# Patient Record
Sex: Male | Born: 1995 | Race: White | Hispanic: No | Marital: Single | Smoking: Never smoker
Health system: Southern US, Community
[De-identification: ages and names within clinical notes are randomized; demographics above are authoritative.]

## PROBLEM LIST (undated history)

## (undated) DIAGNOSIS — J45909 Unspecified asthma, uncomplicated: Secondary | ICD-10-CM

## (undated) DIAGNOSIS — F419 Anxiety disorder, unspecified: Secondary | ICD-10-CM

## (undated) HISTORY — DX: Unspecified asthma, uncomplicated: J45.909

## (undated) HISTORY — DX: Anxiety disorder, unspecified: F41.9

---
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
## 2015-01-04 ENCOUNTER — Ambulatory Visit (INDEPENDENT_AMBULATORY_CARE_PROVIDER_SITE_OTHER): Payer: PRIVATE HEALTH INSURANCE | Admitting: Podiatry

## 2015-01-04 ENCOUNTER — Ambulatory Visit (INDEPENDENT_AMBULATORY_CARE_PROVIDER_SITE_OTHER): Payer: PRIVATE HEALTH INSURANCE

## 2015-01-04 ENCOUNTER — Encounter: Payer: Self-pay | Admitting: Podiatry

## 2015-01-04 VITALS — Ht 69.0 in | Wt 180.0 lb

## 2015-01-04 DIAGNOSIS — M799 Soft tissue disorder, unspecified: Secondary | ICD-10-CM

## 2015-01-04 DIAGNOSIS — M7989 Other specified soft tissue disorders: Secondary | ICD-10-CM

## 2015-01-04 DIAGNOSIS — M76822 Posterior tibial tendinitis, left leg: Secondary | ICD-10-CM

## 2015-01-04 NOTE — Progress Notes (Signed)
   Subjective:    Patient ID: Hayden Willis, male    DOB: Mar 07, 1996, 19 y.o.   MRN: [id number]  HPI knot on both feet , i guess im here for a surgery consult , was here years ago for the left foot. Knot on the medial side of navicular bone bilateral     Review of Systems     Objective:   Physical Exam: I have reviewed his past medical history medications allergies surgery social history and review of systems. Pulses are strongly palpable bilateral. Neurologic sensorium is intact per Semmes-Weinstein monofilament. Deep tendon reflexes are intact. Muscle strength +5 over 5 was afflicted and flexes inverters and everters all intrinsic musculature is intact. Orthopedic evaluation of the straight all joints distal to the ankle for range of motion without crepitation. He has large palpable mass to the medial aspect of the navicular tuberosity. Radiographs do demonstrate a hypertrophic tubercle of the navicular. This very well could be a fused accessory ossicle. His left foot is worse than his right.         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 Left greater than right.  Plan: Discussed etiology pathology conservative versus surgical therapies. We discussed the need for surgical excision of a portion of this navicular bone. This is called a Radio broadcast assistantKidner procedure. We discussed this in great detail today went over a consent form line by line number by number giving him ample time to ask questions he saw fit regarding a Kidner posterior tibial tendon advancement with resection of the hypertrophic navicular bone. I answered all of the questions regarding this procedure is the best of my ability in layman's terms. We discussed the possible postop complications which may include but are not limited to postop pain bleeding swelling infection need for further surgery muscle limb loss of life. He signed out through patient the  consent form and I will follow-up with him in the near future. He notes that he will be nonweightbearing utilizing crutches and a cast.

## 2015-01-22 ENCOUNTER — Telehealth: Payer: Self-pay | Admitting: *Deleted

## 2015-01-22 NOTE — Telephone Encounter (Signed)
PATIENT CALLED AND STATED HE NEEDS TO CANCEL HIS SURGERY FOR THE 15TH OF THIS MONTH, HE HAS [facility]YOUTS ON THE 11TH WILL CALL US BACK TO RESCHEDULE

## 2015-02-03 ENCOUNTER — Encounter: Payer: PRIVATE HEALTH INSURANCE | Admitting: Podiatry

## 2015-02-18 ENCOUNTER — Emergency Department (INDEPENDENT_AMBULATORY_CARE_PROVIDER_SITE_OTHER)
Admission: EM | Admit: 2015-02-18 | Discharge: 2015-02-18 | Disposition: A | Discharge status: Home / Self Care | Payer: PRIVATE HEALTH INSURANCE | Source: Home / Self Care | Attending: Family Medicine | Admitting: Family Medicine

## 2015-02-18 ENCOUNTER — Encounter (HOSPITAL_COMMUNITY): Payer: Self-pay | Admitting: Emergency Medicine

## 2015-02-18 ENCOUNTER — Other Ambulatory Visit (HOSPITAL_COMMUNITY)
Admission: RE | Admit: 2015-02-18 | Discharge: 2015-02-18 | Disposition: A | Discharge status: Home / Self Care | Payer: PRIVATE HEALTH INSURANCE | Source: Ambulatory Visit | Attending: Family Medicine | Admitting: Family Medicine

## 2015-02-18 DIAGNOSIS — Z202 Contact with and (suspected) exposure to infections with a predominantly sexual mode of transmission: Secondary | ICD-10-CM

## 2015-02-18 DIAGNOSIS — R21 Rash and other nonspecific skin eruption: Secondary | ICD-10-CM

## 2015-02-18 DIAGNOSIS — Z113 Encounter for screening for infections with a predominantly sexual mode of transmission: Secondary | ICD-10-CM | POA: Diagnosis not present

## 2015-02-18 LAB — POCT URINALYSIS DIP (DEVICE)
Bilirubin Urine: NEGATIVE
Glucose, UA: NEGATIVE mg/dL
HGB URINE DIPSTICK: NEGATIVE
Ketones, ur: NEGATIVE mg/dL
Leukocytes, UA: NEGATIVE
NITRITE: NEGATIVE
Protein, ur: NEGATIVE mg/dL
Specific Gravity, Urine: 1.025 (ref 1.005–1.030)
UROBILINOGEN UA: 0.2 mg/dL (ref 0.0–1.0)
pH: 7 (ref 5.0–8.0)

## 2015-02-18 MED ORDER — PREDNISONE 10 MG (48) PO TBPK: ORAL_TABLET | Freq: Every day | ORAL | Status: DC

## 2015-02-18 NOTE — Discharge Instructions (Signed)
Your rash is likely from an allergic reaction to something you're coming into contact with the environment or a new soap or detergent. Please start the steroids and taken to completion. Please also start a daily allergy pill such as Zyrtec or Allegra.We will call you if any of your lab results are positive for infection.

## 2015-02-18 NOTE — ED Provider Notes (Signed)
CSN: [id number]     Arrival date & time 02/18/15  0955 History   First MD Initiated Contact with Patient 02/18/15 1121     Chief Complaint  Patient presents with  . Rash  . Exposure to STD    possible exposure   (Consider location/radiation/quality/duration/timing/severity/associated sxs/prior Treatment) HPI  L handand bilateral ankle rash. Intermittently mildly itchy. Started 3 days ago. Getting worse. Putting poison ivy spray w/ improvement. Rash worsens every time he works outside. 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  Sexual intercourse w/o condoms. Patient's x-rays with multiple partners. Not aware of any STDs at his partners have. Patient denies penile discharge, pain, groin adenopathy, fevers, nausea, vomiting, abdominal pain  History reviewed. No pertinent past medical history. History reviewed. No pertinent past surgical history. Family History  Problem Relation Age of Onset  . Diabetes Mother    - Father w/ Diabetes History  Substance Use Topics  . Smoking status: Never Smoker   . Smokeless tobacco: Not on file  . Alcohol Use: No    Review of Systems Per HPI with all other pertinent systems negative.   Allergies  Review of patient's allergies indicates no known allergies.  Home Medications   Prior to Admission medications   Medication Sig Start Date End Date Taking? Authorizing Provider  predniSONE (STERAPRED UNI-PAK 48 TAB) 10 MG (48) TBPK tablet Take by mouth daily. Take as instructed 02/18/15   Ozella Rocksavid J Gitty Osterlund, MD   BP 140/93 mmHg  Pulse 70  Temp(Src) 97.8 F (36.6 C) (Oral)  Resp 12  SpO2 97% Physical Exam Physical Exam  Constitutional: oriented to person, place, and time. appears well-developed and well-nourished. No distress.  HENT:  Head: Normocephalic and atraumatic.  Eyes: EOMI. PERRL.  Neck: Normal range of motion.  Cardiovascular: RRR, no m/r/g, 2+ distal pulses,  Pulmonary/Chest: Effort normal and breath sounds normal. No respiratory  distress.  Abdominal: Soft. Bowel sounds are normal. NonTTP, no distension.  Musculoskeletal: Normal range of motion. Non ttp, no effusion.  Neurological: alert and oriented to person, place, and time.  Skin: mild diffuse erythematous, papular rash on left dorsum of hand and bilateral ankles.Marland Kitchen.  Psychiatric: normal mood and affect. behavior is normal. Judgment and thought content normal.   ED Course  Procedures (including critical care time) Labs Review Labs Reviewed - No data to display  Imaging Review No results found.   MDM   1. Possible exposure to STD   2. Rash    Prednisone Dosepak, start day allergy pill.etiology not immediately clear but likely allergic reaction as opposed to infectious. STD panel sent    Ozella Rocksavid J Neal Trulson, MD 02/18/15 [phone]

## 2015-02-18 NOTE — ED Notes (Signed)
Pt c/o rash on hands and around ankles.  Using otc rash spray with mild relief.   Pt is also requesting std testing.  Pt is asymptomatic at this time.

## 2015-02-19 LAB — HIV ANTIBODY (ROUTINE TESTING W REFLEX): HIV Screen 4th Generation wRfx: NONREACTIVE

## 2015-02-19 LAB — RPR: RPR: NONREACTIVE

## 2015-02-19 LAB — URINE CYTOLOGY ANCILLARY ONLY
Chlamydia: NEGATIVE
NEISSERIA GONORRHEA: NEGATIVE
TRICH (WINDOWPATH): NEGATIVE

## 2015-02-21 NOTE — ED Notes (Signed)
Final reports of STD screening negative for GC, chlamydia, HIV, RPR

## 2015-03-04 ENCOUNTER — Encounter: Payer: Self-pay | Admitting: Podiatry

## 2015-03-04 ENCOUNTER — Telehealth (HOSPITAL_COMMUNITY): Payer: Self-pay | Admitting: *Deleted

## 2015-03-04 NOTE — ED Notes (Signed)
[facility]. States he called and left messages but no one called him back. Pt. told we would call when nurse was available.  I called pt. Pt. verified x 2 and given neg. results.  Pt. sounds anxious and was reassured. Vassie MoselleYork, Shonna Deiter M 03/04/2015

## 2015-09-03 ENCOUNTER — Encounter: Payer: Self-pay | Admitting: Family Medicine

## 2015-09-03 ENCOUNTER — Ambulatory Visit (INDEPENDENT_AMBULATORY_CARE_PROVIDER_SITE_OTHER): Payer: PRIVATE HEALTH INSURANCE | Admitting: Family Medicine

## 2015-09-03 VITALS — BP 118/76 | HR 98 | Temp 97.8°F | Resp 16 | Ht 69.0 in | Wt 214.6 lb

## 2015-09-03 DIAGNOSIS — A059 Bacterial foodborne intoxication, unspecified: Secondary | ICD-10-CM

## 2015-09-03 DIAGNOSIS — R51 Headache: Secondary | ICD-10-CM | POA: Diagnosis not present

## 2015-09-03 DIAGNOSIS — R519 Headache, unspecified: Secondary | ICD-10-CM

## 2015-09-03 DIAGNOSIS — R42 Dizziness and giddiness: Secondary | ICD-10-CM

## 2015-09-03 DIAGNOSIS — R509 Fever, unspecified: Secondary | ICD-10-CM | POA: Diagnosis not present

## 2015-09-03 MED ORDER — OMEPRAZOLE 20 MG PO CPDR: 20.0000 mg | DELAYED_RELEASE_CAPSULE | Freq: Every day | ORAL | Status: DC

## 2015-09-03 MED ORDER — ONDANSETRON HCL 4 MG PO TABS: 4.0000 mg | ORAL_TABLET | Freq: Three times a day (TID) | ORAL | Status: DC | PRN

## 2015-09-03 NOTE — Patient Instructions (Signed)
Food Poisoning °Food poisoning is an illness caused by something you ate or drank. There are over 250 known causes of food poisoning. However, many other causes are unknown. You can be treated even if the exact cause of your food poisoning is not known. In most cases, food poisoning is mild and lasts 1 to 2 days. However, some cases can be serious, especially for people with low immune systems, the elderly, children and infants, and pregnant women. °CAUSES  °Poor personal hygiene, improper cleaning of storage and preparation areas, and unclean utensils can cause infection or tainting (contamination) of foods. The causes of food poisoning are numerous. Infectious agents, such as viruses, bacteria, or parasites, can cause harm by infecting the intestine and disrupting the absorption of nutrients and water. This can cause diarrhea and lead to dehydration. Viruses are responsible for most of the food poisonings in which an agent is found. Parasites are less likely to cause food poisoning. Toxic agents, such as poisonous mushrooms, marine algae, and pesticides can also cause food poisoning. °· Viral causes of food poisoning include: °¨ Norovirus. °¨ Rotavirus. °¨ Hepatitis A. °· Bacterial causes of food poisoning include: °¨ Salmonellae. °¨ Campylobacter. °¨ Bacillus cereus. °¨ Escherichia coli (E. coli). °¨ Shigella. °¨ Listeria monocytogenes. °¨ Clostridium botulinum (botulism). °¨ Vibrio cholerae. °· Parasites that can cause food poisoning include: °¨ Giardia. °¨ Cryptosporidium. °¨ Toxoplasma. °SYMPTOMS °Symptoms may appear several hours or longer after consuming the contaminated food or drink. Symptoms may include: °· Nausea. °· Vomiting. °· Cramping. °· Diarrhea. °· Fever and chills. °· Muscle aches. °DIAGNOSIS °Your health care provider may be able to diagnose food poisoning from a list of what you have recently eaten and results from lab tests. Diagnostic tests may include an exam of the feces. °TREATMENT °In  most cases, treatment focuses on helping to relieve your symptoms and staying well hydrated. Antibiotic medicines are rarely needed. In severe cases, hospitalization may be required. °HOME CARE INSTRUCTIONS  °· Drink enough water and fluids to keep your urine clear or pale yellow. Drink small amounts of fluids frequently and increase as tolerated. °· Ask your health care provider for specific rehydration instructions. °· Avoid: °¨ Foods high in sugar. °¨ Alcohol. °¨ Carbonated drinks. °¨ Tobacco. °¨ Juice. °¨ Caffeine drinks. °¨ Extremely hot or cold fluids. °¨ Fatty, greasy foods. °¨ Too much intake of anything at one time. °¨ Dairy products until 24 to 48 hours after diarrhea stops. °· You may consume probiotics. Probiotics are active cultures of beneficial bacteria. They may lessen the amount and number of diarrheal stools in adults. Probiotics can be found in yogurt with active cultures and in supplements. °· Wash your hands well to avoid spreading the bacteria. °· Take medicines only as directed by your health care provider. Do not give your child aspirin because of the association with Reye's syndrome. °· Ask your health care provider if you should continue to take your regular prescribed and over-the-counter medicines. °PREVENTION  °· Wash your hands, food preparation surfaces, and utensils thoroughly before and after handling raw foods. °· Keep refrigerated foods below 40°F (5°C). °· Serve hot foods immediately or keep them heated above 140°F (60°C). °· Divide large volumes of food into small portions for rapid cooling in the refrigerator. Hot, bulky foods in the refrigerator can raise the temperature of other foods that have already cooled. °· Follow approved canning procedures. °· Heat canned foods thoroughly before tasting. °· When in doubt, throw it out. °· Infants, the elderly, women   who are pregnant, and people with compromised immune systems are especially susceptible to food poisoning. These people  should never consume unpasteurized cheese, unpasteurized cider, raw fish, raw seafood, or raw meat-type products. °SEEK IMMEDIATE MEDICAL CARE IF:  °· You have difficulty breathing, swallowing, talking, or moving. °· You develop blurred vision. °· You are unable to keep fluids down. °· You faint or nearly faint. °· Your eyes turn yellow. °· Vomiting or diarrhea develops or becomes persistent. °· Abdominal pain develops, increases, or localizes in one small area. °· You have a fever. °· The diarrhea becomes excessive or contains blood or mucus. °· You develop excessive weakness, dizziness, or extreme thirst. °· You have no urine for 8 hours. °MAKE SURE YOU:  °· Understand these instructions. °· Will watch your condition. °· Will get help right away if you are not doing well or get worse. °  °This information is not intended to replace advice given to you by your health care provider. Make sure you discuss any questions you have with your health care provider. °  °Document Released: 06/30/2004 Document Revised: 10/23/2014 Document Reviewed: 04/05/2015 °Elsevier Interactive Patient Education ©2016 [employer] ° °

## 2015-09-03 NOTE — Progress Notes (Signed)
Name: Hayden Willis   MRN: [id number]    DOB: 1996-04-29   Date:09/03/2015       Progress Note  Subjective  Chief Complaint  Chief Complaint  Patient presents with  . Nausea    ate at [employer], bit into a sharp bone from a snack wrap. Started having nasuea, headaches, not feeling well  . Headache    10 plus for headache pain 3 days ago    HPI   Food poisoning  To 3 days ago the patient states he ate a [place] [employer] [place] He states that the wrap contain a bone and he immediately spit it out but swallowed some of the. He took the food back into the manager and received a different meal. Within a few minutes he began experiencing nausea vomiting or severe headache myalgias blurred vision. He has had some subsequent loose stools. He has felt warm but is had no documented fever. He continues to feel weak. He still has some nausea without vomiting and stools are not quite back to normal. There is no abdominal pain. There is no melena or hematochezia any time. There is no history of any GI disease in the past.   History reviewed. No pertinent past medical history.  Social History  Substance Use Topics  . Smoking status: Never Smoker   . Smokeless tobacco: Never Used  . Alcohol Use: No     Current outpatient prescriptions:  .  predniSONE (STERAPRED UNI-PAK 48 TAB) 10 MG (48) TBPK tablet, Take by mouth daily. Take as instructed (Patient not taking: Reported on 09/03/2015), Disp: 48 tablet, Rfl: 0  No Known Allergies  Review of Systems  Constitutional: Positive for malaise/fatigue. Negative for fever, chills and weight loss.  HENT: Negative for congestion, hearing loss, sore throat and tinnitus.   Eyes: Positive for blurred vision. Negative for double vision and redness.  Respiratory: Negative for cough, hemoptysis and shortness of breath.   Cardiovascular: Negative for chest pain, palpitations, orthopnea, claudication and leg swelling.  Gastrointestinal: Positive  for nausea, vomiting and abdominal pain. Negative for heartburn, diarrhea, constipation and blood in stool.  Genitourinary: Negative for dysuria, urgency, frequency and hematuria.  Musculoskeletal: Positive for myalgias. Negative for back pain, joint pain, falls and neck pain.  Skin: Negative for itching.  Neurological: Positive for dizziness and headaches. Negative for tingling, tremors, focal weakness, seizures, loss of consciousness and weakness.  Endo/Heme/Allergies: Does not bruise/bleed easily.  Psychiatric/Behavioral: Negative for depression and substance abuse. The patient is not nervous/anxious and does not have insomnia.      Objective  Filed Vitals:   09/03/15 0959  BP: 118/76  Pulse: 98  Temp: 97.8 F (36.6 C)  TempSrc: Oral  Resp: 16  Height: 5\' 9"  (1.753 m)  Weight: 214 lb 9.6 oz (97.342 kg)  SpO2: 98%     Physical Exam  Constitutional: He is oriented to person, place, and time and well-developed, well-nourished, and in no distress.  HENT:  Head: Normocephalic.  Eyes: EOM are normal. Pupils are equal, round, and reactive to light.  Neck: Normal range of motion. Neck supple. No thyromegaly present.  Cardiovascular: Normal rate, regular rhythm and normal heart sounds.   No murmur heard. Pulmonary/Chest: Effort normal and breath sounds normal. No respiratory distress. He has no wheezes.  Abdominal: Soft. Bowel sounds are normal. He exhibits no distension. There is no tenderness. There is no rebound and no guarding.  Musculoskeletal: Normal range of motion. He exhibits no edema.  Lymphadenopathy:  He has no cervical adenopathy.  Neurological: He is alert and oriented to person, place, and time. No cranial nerve deficit. Gait normal. Coordination normal.  Skin: Skin is warm and dry. No rash noted.  Psychiatric: Affect and judgment normal.      Assessment & Plan  1. Food poisoning No antibiotics indicated - ondansetron (ZOFRAN) 4 MG tablet; Take 1 tablet (4  mg total) by mouth every 8 (eight) hours as needed for nausea or vomiting.  Dispense: 20 tablet; Refill: 0 - omeprazole (PRILOSEC) 20 MG capsule; Take 1 capsule (20 mg total) by mouth daily.  Dispense: 30 capsule; Refill: 3 - CBC - Comprehensive Metabolic Panel (CMET) - HIV antibody - Hepatitis A antibody, IgM  2. Dizziness  - CBC - Comprehensive Metabolic Panel (CMET) - HIV antibody - Hepatitis A antibody, IgM  3. Headache, unspecified headache type Probably secondary to #1 - Comprehensive Metabolic Panel (CMET) - HIV antibody  4. Fever and chills Secondary #1 - CBC - HIV antibody

## 2015-09-04 LAB — COMPREHENSIVE METABOLIC PANEL
ALT: 108 IU/L — AB (ref 0–44)
AST: 36 IU/L (ref 0–40)
Albumin/Globulin Ratio: 1.8 (ref 1.1–2.5)
Albumin: 4.3 g/dL (ref 3.5–5.5)
Alkaline Phosphatase: 61 IU/L (ref 39–117)
BUN/Creatinine Ratio: 12 (ref 8–19)
BUN: 13 mg/dL (ref 6–20)
Bilirubin Total: 0.5 mg/dL (ref 0.0–1.2)
CALCIUM: 9.7 mg/dL (ref 8.7–10.2)
CO2: 26 mmol/L (ref 18–29)
CREATININE: 1.09 mg/dL (ref 0.76–1.27)
Chloride: 104 mmol/L (ref 97–106)
GFR calc Af Amer: 112 mL/min/{1.73_m2} (ref >59)
GFR, EST NON AFRICAN AMERICAN: 97 mL/min/{1.73_m2} (ref >59)
Globulin, Total: 2.4 g/dL (ref 1.5–4.5)
Glucose: 79 mg/dL (ref 65–99)
POTASSIUM: 4.9 mmol/L (ref 3.5–5.2)
Sodium: 143 mmol/L (ref 136–144)
Total Protein: 6.7 g/dL (ref 6.0–8.5)

## 2015-09-04 LAB — CBC
Hematocrit: 46.2 % (ref 37.5–51.0)
Hemoglobin: 16 g/dL (ref 12.6–17.7)
MCH: 29.8 pg (ref 26.6–33.0)
MCHC: 34.6 g/dL (ref 31.5–35.7)
MCV: 86 fL (ref 79–97)
PLATELETS: 214 10*3/uL (ref 150–379)
RBC: 5.37 x10E6/uL (ref 4.14–5.80)
RDW: 13.8 % (ref 12.3–15.4)
WBC: 6.1 10*3/uL (ref 3.4–10.8)

## 2015-09-04 LAB — HEPATITIS A ANTIBODY, IGM: HEP A IGM: NEGATIVE

## 2015-09-04 LAB — HIV ANTIBODY (ROUTINE TESTING W REFLEX): HIV SCREEN 4TH GENERATION: NONREACTIVE

## 2015-09-07 ENCOUNTER — Telehealth: Payer: Self-pay

## 2015-09-07 NOTE — Telephone Encounter (Signed)
-----   Message from Dennison MascotLemont Morrisey, MD sent at 09/06/2015  7:40 AM EST ----- A.l. T is elevated consistent with some liver enzyme elevation. If he is taking Tylenol he needs to stop it and if he is drinking as alcohol excessively this was stopped. He should return within 2-4 weeks for a recheck of his this test. Again all of the labs are negative

## 2015-09-07 NOTE — Telephone Encounter (Signed)
Voicemail was not set up, tried to call to leave vm regarding labs.

## 2015-09-14 ENCOUNTER — Telehealth: Payer: Self-pay | Admitting: Family Medicine

## 2015-09-14 DIAGNOSIS — R748 Abnormal levels of other serum enzymes: Secondary | ICD-10-CM

## 2015-09-14 NOTE — Telephone Encounter (Signed)
PT WAS NOTIFIED AND SAID WOULD LET DAD KNOW WAS READY

## 2015-09-14 NOTE — Telephone Encounter (Signed)
Slip has been printed and placed in file cabinet. Pt was instructed when called by Tiffany to wait 2-4 weeks after initial appt. to redo lab work.

## 2015-09-14 NOTE — Telephone Encounter (Signed)
PT NEEDS LABS SLIP FOR FU TO LIVER LABS WAS ELEVATED. SAID DR HAD REQUESTED IT BE DONE AGAIN.

## 2015-10-20 ENCOUNTER — Telehealth: Payer: Self-pay

## 2015-10-20 NOTE — Telephone Encounter (Signed)
Tried to contact this patient to see if he was interested in getting his influenza vaccine, but there was no answer and I was not able to leave a message due to his voicemail not being setup.

## 2016-03-10 ENCOUNTER — Telehealth: Payer: Self-pay | Admitting: *Deleted

## 2016-03-10 NOTE — Telephone Encounter (Signed)
Patient called-states that he received a notice about a bill. He states he will be sending in a $100 payment on 03/14/16 and the remaining in 2 wks after. He would like a call to confirm we received this message.

## 2016-12-04 DIAGNOSIS — J45990 Exercise induced bronchospasm: Secondary | ICD-10-CM | POA: Insufficient documentation

## 2016-12-04 DIAGNOSIS — J4541 Moderate persistent asthma with (acute) exacerbation: Secondary | ICD-10-CM | POA: Insufficient documentation

## 2016-12-04 DIAGNOSIS — K219 Gastro-esophageal reflux disease without esophagitis: Secondary | ICD-10-CM | POA: Insufficient documentation

## 2016-12-18 ENCOUNTER — Ambulatory Visit: Payer: PRIVATE HEALTH INSURANCE | Admitting: Adult Health

## 2018-07-05 ENCOUNTER — Other Ambulatory Visit: Payer: Self-pay | Admitting: Physician Assistant

## 2018-07-05 ENCOUNTER — Ambulatory Visit
Admission: RE | Admit: 2018-07-05 | Discharge: 2018-07-05 | Disposition: A | Discharge status: Home / Self Care | Payer: 59 | Source: Ambulatory Visit | Attending: Physician Assistant | Admitting: Physician Assistant

## 2018-07-05 DIAGNOSIS — S300XXA Contusion of lower back and pelvis, initial encounter: Secondary | ICD-10-CM

## 2018-08-31 ENCOUNTER — Emergency Department (HOSPITAL_COMMUNITY)
Admission: EM | Admit: 2018-08-31 | Discharge: 2018-08-31 | Disposition: A | Discharge status: Home / Self Care | Payer: 59 | Attending: Emergency Medicine | Admitting: Emergency Medicine

## 2018-08-31 ENCOUNTER — Emergency Department (HOSPITAL_COMMUNITY): Payer: 59

## 2018-08-31 DIAGNOSIS — J9801 Acute bronchospasm: Secondary | ICD-10-CM | POA: Diagnosis not present

## 2018-08-31 DIAGNOSIS — R0602 Shortness of breath: Secondary | ICD-10-CM | POA: Diagnosis present

## 2018-08-31 DIAGNOSIS — F419 Anxiety disorder, unspecified: Secondary | ICD-10-CM | POA: Insufficient documentation

## 2018-08-31 DIAGNOSIS — Z79899 Other long term (current) drug therapy: Secondary | ICD-10-CM | POA: Diagnosis not present

## 2018-08-31 LAB — CBC WITH DIFFERENTIAL/PLATELET
Abs Immature Granulocytes: 0.03 K/uL (ref 0.00–0.07)
Basophils Absolute: 0.1 K/uL (ref 0.0–0.1)
Basophils Relative: 1 %
Eosinophils Absolute: 0.5 K/uL (ref 0.0–0.5)
Eosinophils Relative: 8 %
HCT: 46.5 % (ref 39.0–52.0)
Hemoglobin: 14.8 g/dL (ref 13.0–17.0)
Immature Granulocytes: 1 %
Lymphocytes Relative: 38 %
Lymphs Abs: 2.3 K/uL (ref 0.7–4.0)
MCH: 28.6 pg (ref 26.0–34.0)
MCHC: 31.8 g/dL (ref 30.0–36.0)
MCV: 89.9 fL (ref 80.0–100.0)
Monocytes Absolute: 0.6 K/uL (ref 0.1–1.0)
Monocytes Relative: 11 %
Neutro Abs: 2.5 K/uL (ref 1.7–7.7)
Neutrophils Relative %: 41 %
Platelets: 199 K/uL (ref 150–400)
RBC: 5.17 MIL/uL (ref 4.22–5.81)
RDW: 12.7 % (ref 11.5–15.5)
WBC: 6 K/uL (ref 4.0–10.5)
nRBC: 0 % (ref 0.0–0.2)

## 2018-08-31 LAB — BASIC METABOLIC PANEL WITH GFR
Anion gap: 5 (ref 5–15)
BUN: 15 mg/dL (ref 6–20)
CO2: 23 mmol/L (ref 22–32)
Calcium: 9.1 mg/dL (ref 8.9–10.3)
Chloride: 112 mmol/L — ABNORMAL HIGH (ref 98–111)
Creatinine, Ser: 1.24 mg/dL (ref 0.61–1.24)
GFR calc Af Amer: >60 mL/min
GFR calc non Af Amer: >60 mL/min
Glucose, Bld: 109 mg/dL — ABNORMAL HIGH (ref 70–99)
Potassium: 4 mmol/L (ref 3.5–5.1)
Sodium: 140 mmol/L (ref 135–145)

## 2018-08-31 LAB — D-DIMER, QUANTITATIVE: D-Dimer, Quant: <0.27 ug{FEU}/mL (ref 0.00–0.50)

## 2018-08-31 MED ORDER — PREDNISONE 20 MG PO TABS
60.0000 mg | ORAL_TABLET | Freq: Once | ORAL | Status: AC
  Administered 2018-08-31: 60 mg via ORAL
  Filled 2018-08-31: qty 3

## 2018-08-31 MED ORDER — PREDNISONE 50 MG PO TABS: ORAL_TABLET | ORAL | 0 refills | Status: DC

## 2018-08-31 MED ORDER — IPRATROPIUM-ALBUTEROL 0.5-2.5 (3) MG/3ML IN SOLN
3.0000 mL | Freq: Once | RESPIRATORY_TRACT | Status: AC
  Administered 2018-08-31: 3 mL via RESPIRATORY_TRACT
  Filled 2018-08-31: qty 3

## 2018-08-31 MED ORDER — ALBUTEROL SULFATE HFA 108 (90 BASE) MCG/ACT IN AERS: 1.0000 | INHALATION_SPRAY | Freq: Four times a day (QID) | RESPIRATORY_TRACT | 0 refills | Status: AC | PRN

## 2018-08-31 NOTE — ED Provider Notes (Signed)
[facility] Provider Note   CSN: [id number] Arrival date & time: 08/31/18  0241     History   Chief Complaint Chief Complaint  Patient presents with  . Shortness of Breath    HPI Hayden Willis is a 22 y.o. male.  The history is provided by the patient.  Shortness of Breath  This is a new problem. The average episode lasts 2 weeks. The problem occurs frequently.The problem has been gradually worsening. Associated symptoms include cough and wheezing. Pertinent negatives include no fever, no chest pain, no syncope, no abdominal pain, no leg pain and no leg swelling. Associated symptoms comments: Reports he tastes blood when he coughs. He has tried beta-agonist inhalers for the symptoms. The treatment provided moderate relief.    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  He reports for the past 2 weeks his breathing has worsened.  He reports shortness of breath frequently.  He reports it seems worse when he is exercising.  He reports when he coughs he tastes blood.  No chest pain.  No lower extremity edema. He reports his been using albuterol with some improvement. No History of CAD/PE/DVT.  No recent travel  Patient Active Problem List   Diagnosis Date Noted  . NONALLOPATHIC LESION OF LOWER EXTREMITIES NEC 06/21/2010  . FOOT PAIN, LEFT 06/08/2010  . COCCYGEAL PAIN 02/14/2010  . ABRASION/FRICION BURN OTH MX&UNS SITE W/O INF 02/14/2010  . MUSCLE STRAIN, HAMSTRING MUSCLE 12/30/2009  . TMJ PAIN 09/25/2009  . KNEE PAIN, RIGHT 09/25/2009  . ATTENTION DEFICIT HYPERACTIVITY DISORDER 08/16/2009  . Contact dermatitis and other eczema due to plants (except food) 02/20/2008    No past surgical history on file.      Home Medications    Prior to Admission medications   Medication Sig Start Date End Date Taking? Authorizing Provider  omeprazole (PRILOSEC) 20 MG capsule Take 1 capsule (20 mg total) by mouth daily. 09/03/15   Dennison MascotMorrisey,  Lemont, MD  ondansetron (ZOFRAN) 4 MG tablet Take 1 tablet (4 mg total) by mouth every 8 (eight) hours as needed for nausea or vomiting. 09/03/15   Dennison MascotMorrisey, Lemont, MD  predniSONE (STERAPRED UNI-PAK 48 TAB) 10 MG (48) TBPK tablet Take by mouth daily. Take as instructed Patient not taking: Reported on 09/03/2015 02/18/15   Ozella RocksMerrell, David J, MD    Family History Family History  Problem Relation Age of Onset  . Diabetes Mother     Social History Social History   Tobacco Use  . Smoking status: Never Smoker  . Smokeless tobacco: Never Used  Substance Use Topics  . Alcohol use: No    Alcohol/week: 0.0 standard drinks  . Drug use: No     Allergies   Patient has no known allergies.   Review of Systems Review of Systems  Constitutional: Negative for fever.  HENT: Negative for trouble swallowing.   Respiratory: Positive for cough, shortness of breath and wheezing.   Cardiovascular: Negative for chest pain, leg swelling and syncope.  Gastrointestinal: Negative for abdominal pain.  All other systems reviewed and are negative.    Physical Exam Updated Vital Signs Pulse 95   Temp 97.6 F (36.4 C) (Oral)   Resp 18   SpO2 99%   Physical Exam  CONSTITUTIONAL: Well developed/well nourished HEAD: Normocephalic/atraumatic EYES: EOMI/PERRL ENMT: Mucous membranes moist, uvula midline, airway patent, no stridor, no drooling NECK: supple no meningeal signs SPINE/BACK:entire spine nontender CV: S1/S2 noted, no murmurs/rubs/gallops noted LUNGS: Lungs are clear to auscultation  bilaterally, no apparent distress ABDOMEN: soft, nontender, no rebound or guarding, bowel sounds noted throughout abdomen GU:no cva tenderness NEURO: Pt is awake/alert/appropriate, moves all extremitiesx4.  No facial droop.   EXTREMITIES: pulses normal/equal, full ROM, no calf tenderness or edema SKIN: warm, color normal PSYCH: Anxious  ED Treatments / Results  Labs (all labs ordered are listed, but only  abnormal results are displayed) Labs Reviewed  BASIC METABOLIC PANEL - Abnormal; Notable for the following components:      Result Value   Chloride 112 (*)    Glucose, Bld 109 (*)    All other components within normal limits  CBC WITH DIFFERENTIAL/PLATELET  D-DIMER, QUANTITATIVE (NOT AT [facility])    EKG EKG Interpretation  Date/Time:  Saturday August 31 2018 03:38:42 EST Ventricular Rate:  73 PR Interval:    QRS Duration: 100 QT Interval:  373 QTC Calculation: 411 R Axis:   76 Text Interpretation:  Sinus rhythm No previous ECGs available Confirmed by Zadie Rhine ([id number]) on 08/31/2018 3:46:30 AM   Radiology Dg Chest 2 View  Result Date: 08/31/2018 CLINICAL DATA:  Acute onset of shortness of breath. Exercise-induced bronchospasm. EXAM: CHEST - 2 VIEW COMPARISON:  Chest radiograph performed 09/23/2009 FINDINGS: The lungs are well-aerated and clear. There is no evidence of focal opacification, pleural effusion or pneumothorax. The heart is normal in size; the mediastinal contour is within normal limits. No acute osseous abnormalities are seen. IMPRESSION: No acute cardiopulmonary process seen. Electronically Signed   By: Roanna Raider M.D.   On: 08/31/2018 03:29    Procedures Procedures  Medications Ordered in ED Medications  ipratropium-albuterol (DUONEB) 0.5-2.5 (3) MG/3ML nebulizer solution 3 mL (3 mLs Nebulization Given 08/31/18 0426)  predniSONE (DELTASONE) tablet 60 mg (60 mg Oral Given 08/31/18 0424)     Initial Impression / Assessment and Plan / ED Course  I have reviewed the triage vital signs and the nursing notes.  Pertinent labs & imaging results that were available during my care of the patient were reviewed by me and considered in my medical decision making (see chart for details).     Patient presented with shortness of breath and increased cough.  He reports he has been diagnosed with bronchitis as before, but this was worse.  Extensive evaluation was  done emergency department, no signs of PE.  Chest x-ray is negative He ambulated and reports he feels well.  Vitals are appropriate.  I suspect he may have worsening asthma.  Advise follow with PCP, will put him on a burst of steroids.  Final Clinical Impressions(s) / ED Diagnoses   Final diagnoses:  Bronchospasm, acute    ED Discharge Orders         Ordered    predniSONE (DELTASONE) 50 MG tablet     08/31/18 0625    albuterol (PROVENTIL HFA;VENTOLIN HFA) 108 (90 Base) MCG/ACT inhaler  Every 6 hours PRN     08/31/18 0625           Zadie Rhine, MD 08/31/18 [phone]

## 2018-08-31 NOTE — ED Notes (Signed)
Patient transported to X-ray 

## 2018-08-31 NOTE — ED Triage Notes (Signed)
Patient from home, c/o SOB describing it as "breathing through a straw". 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 NAD in triage. Used albuterol inhaler at home with no relief.

## 2018-08-31 NOTE — Progress Notes (Signed)
SATURATION QUALIFICATIONS: (This note is used to comply with regulatory documentation for home oxygen)  Patient Saturations on Room Air at Rest = 100%  Patient Saturations on Room Air while Ambulating = 98% 

## 2018-09-25 IMAGING — CT CT PELVIS W/O CM
1 series · 16 of 32 positions shown, 20 images · non-contrast
Comparison: Radiograph September 23, 2009.

CLINICAL DATA: Right sacral pain after fall 3 weeks ago.

EXAM:
CT PELVIS WITHOUT CONTRAST
TECHNIQUE: Multidetector CT imaging of the pelvis was performed following the
standard protocol without intravenous contrast.

[Series 3: bone · axial · 0.77mm/px · z∈[-260,-50]mm · 16 of 79 slices shown, 20 images]
[im 6/79  soft-tissue]
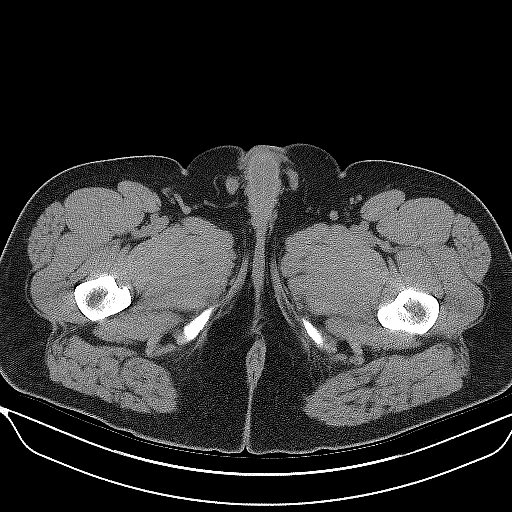
[im 6/79  bone]
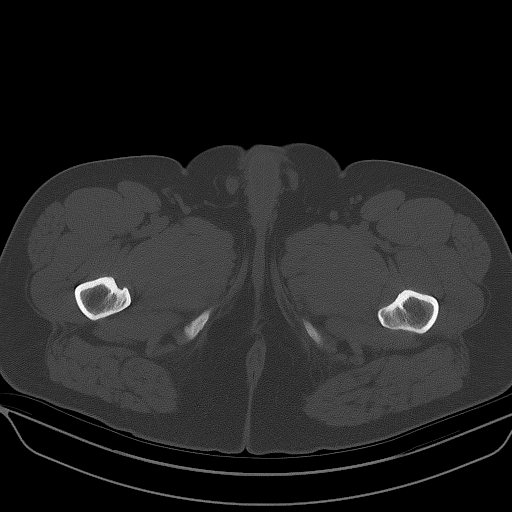
[im 11/79  soft-tissue]
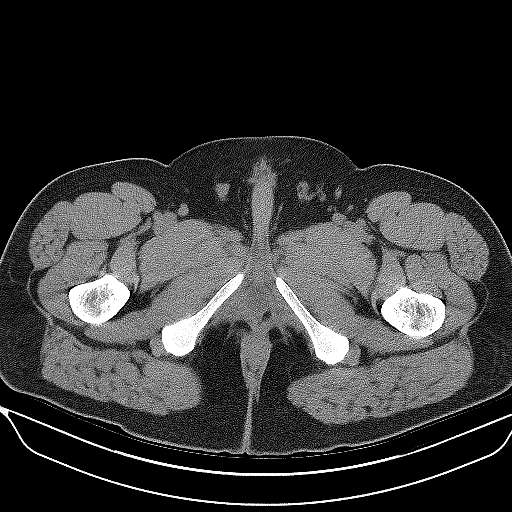
[im 16/79  soft-tissue]
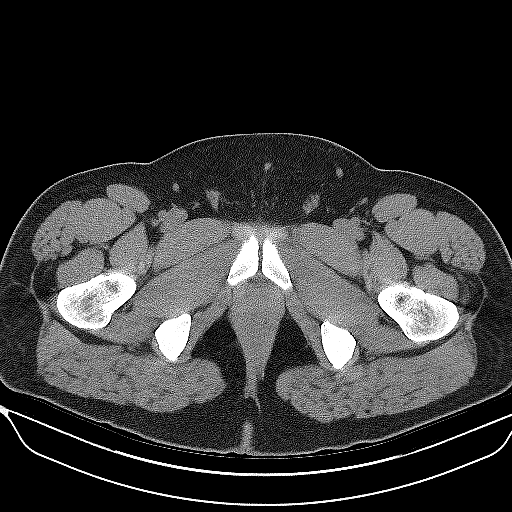
[im 21/79  soft-tissue]
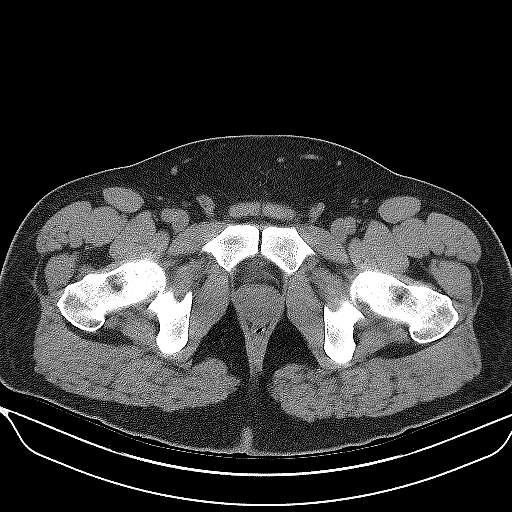
[im 26/79  soft-tissue]
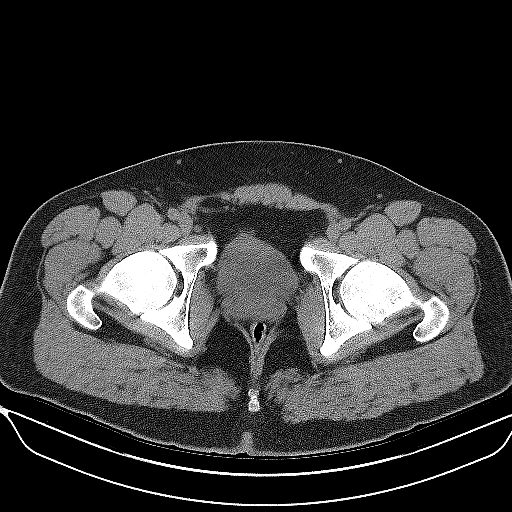
[im 31/79  soft-tissue]
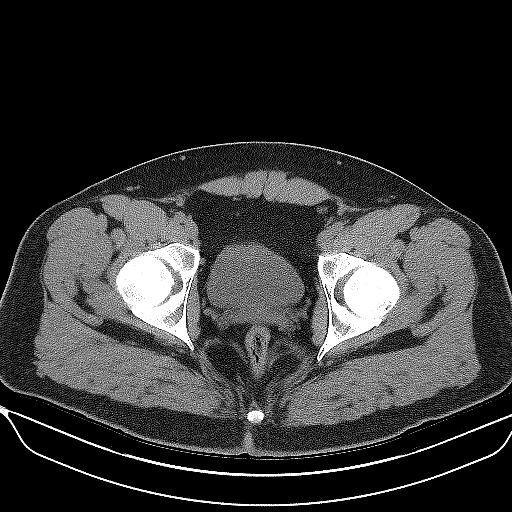
[im 36/79  soft-tissue]
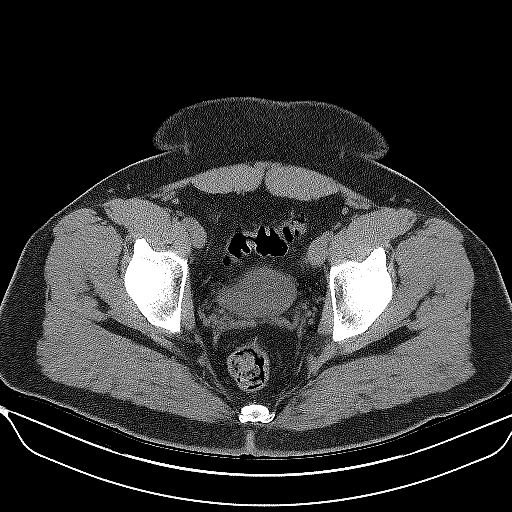
[im 43/79  soft-tissue]
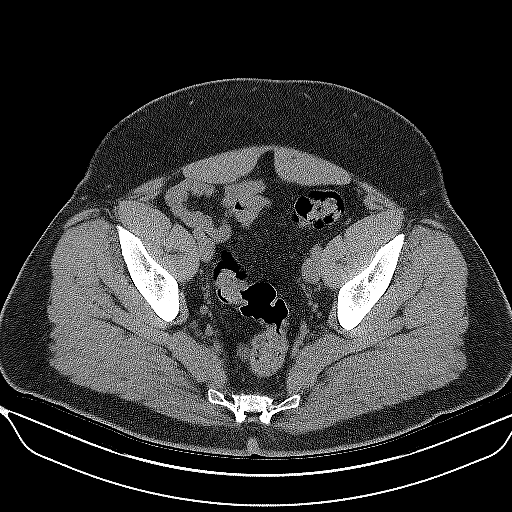
[im 48/79  soft-tissue]
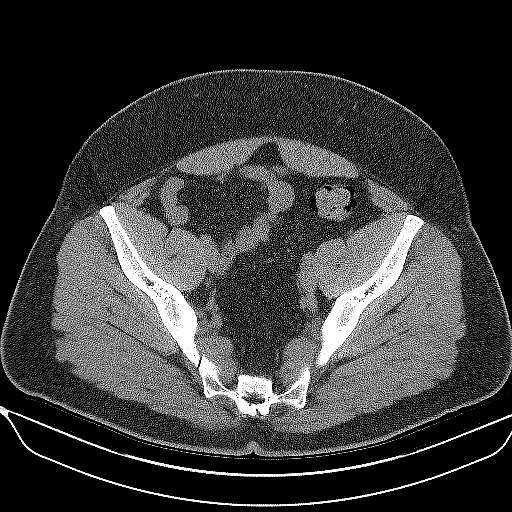
[im 48/79  bone]
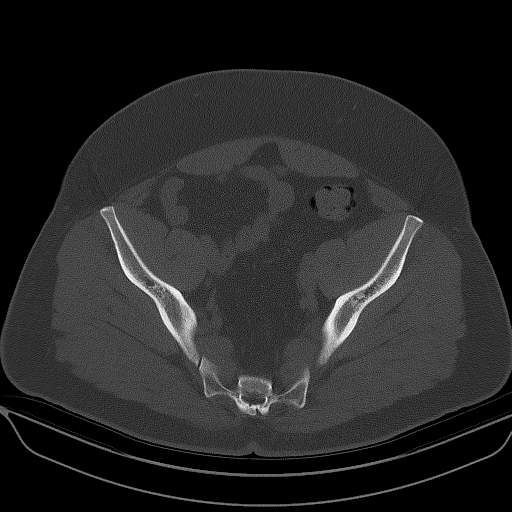
[im 53/79  soft-tissue]
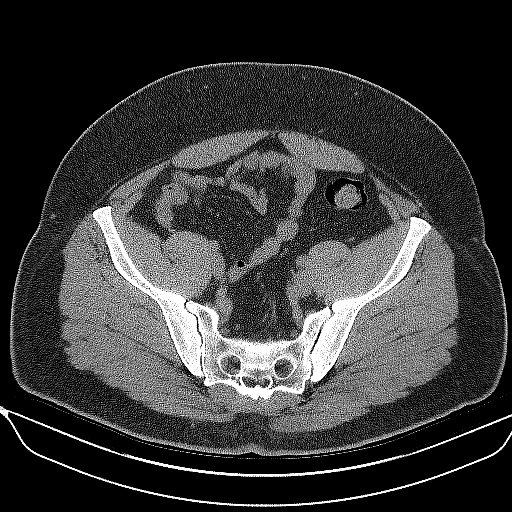
[im 58/79  soft-tissue]
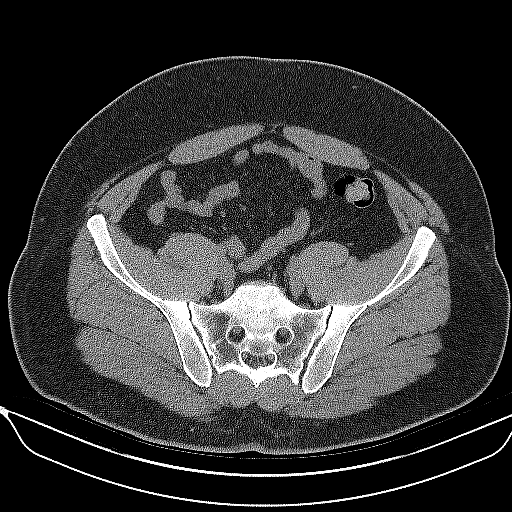
[im 63/79  soft-tissue]
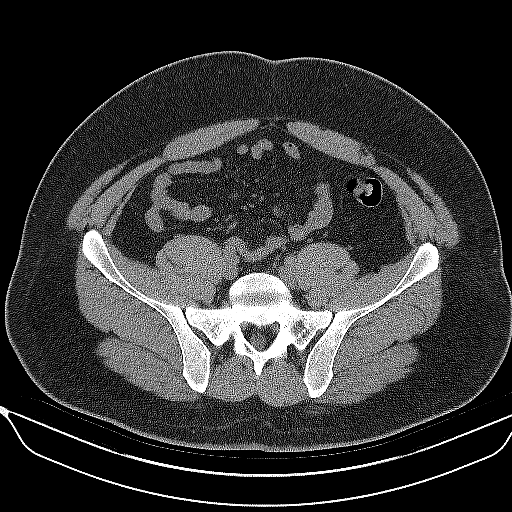
[im 68/79  soft-tissue]
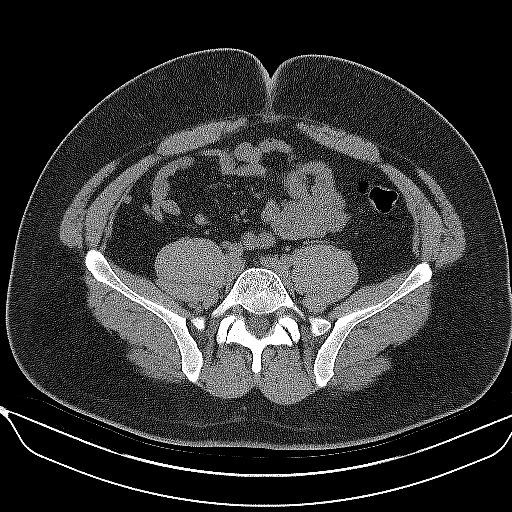
[im 68/79  lung]
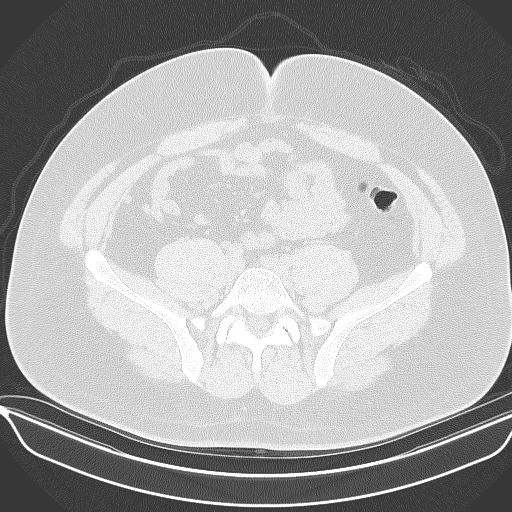
[im 71/79  lung]
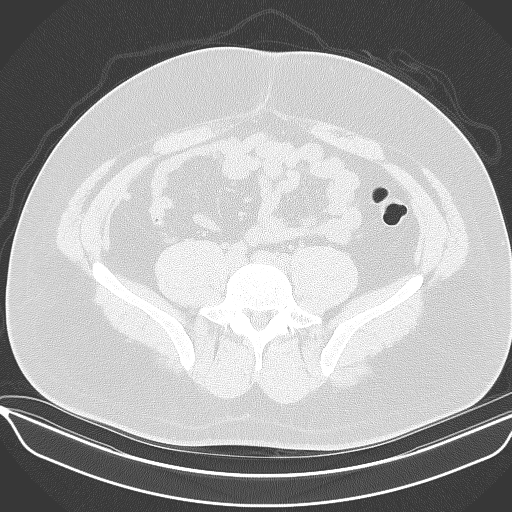
[im 73/79  soft-tissue]
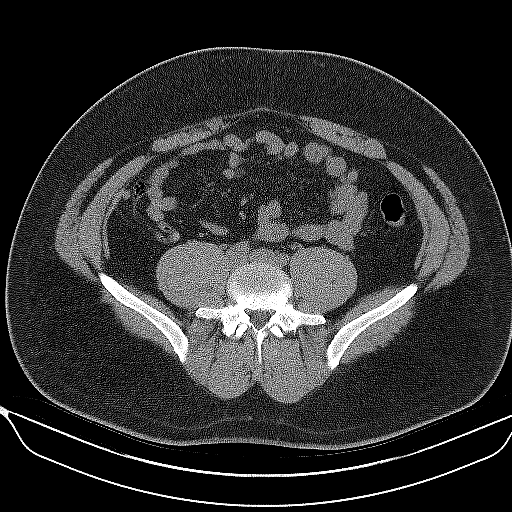
[im 73/79  lung]
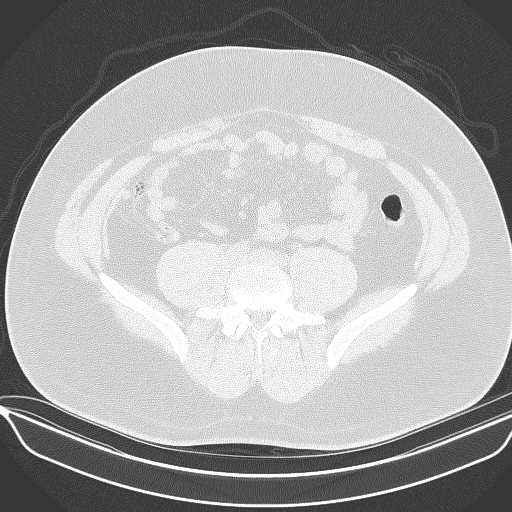
[im 76/79  lung]
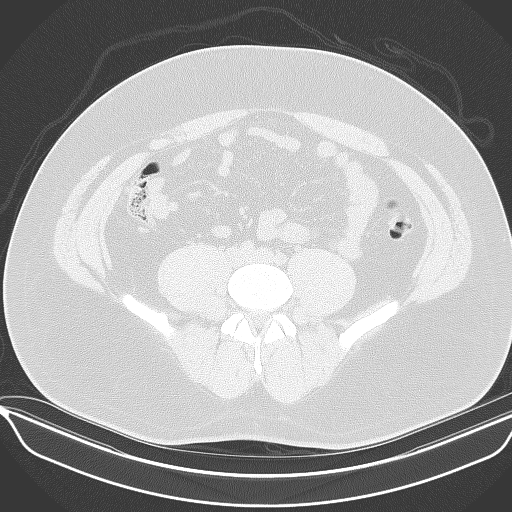

[16 of 32 positions shown; findings below may reference images not displayed]

FINDINGS: Urinary Tract:  No abnormality visualized.

Bowel:  Unremarkable visualized pelvic bowel loops.

Vascular/Lymphatic: No pathologically enlarged lymph nodes. No
significant vascular abnormality seen.

Reproductive:  No mass or other significant abnormality

Other:  None.

Musculoskeletal: No suspicious bone lesions identified.
IMPRESSION: No abnormality seen in the pelvis.

## 2018-11-21 IMAGING — CR DG CHEST 2V
2 series · 2 of 2 positions shown · non-contrast
Comparison: Chest radiograph performed 09/23/2009

CLINICAL DATA: Acute onset of shortness of breath. Exercise-induced
bronchospasm.

EXAM:
CHEST - 2 VIEW

[chest pa]
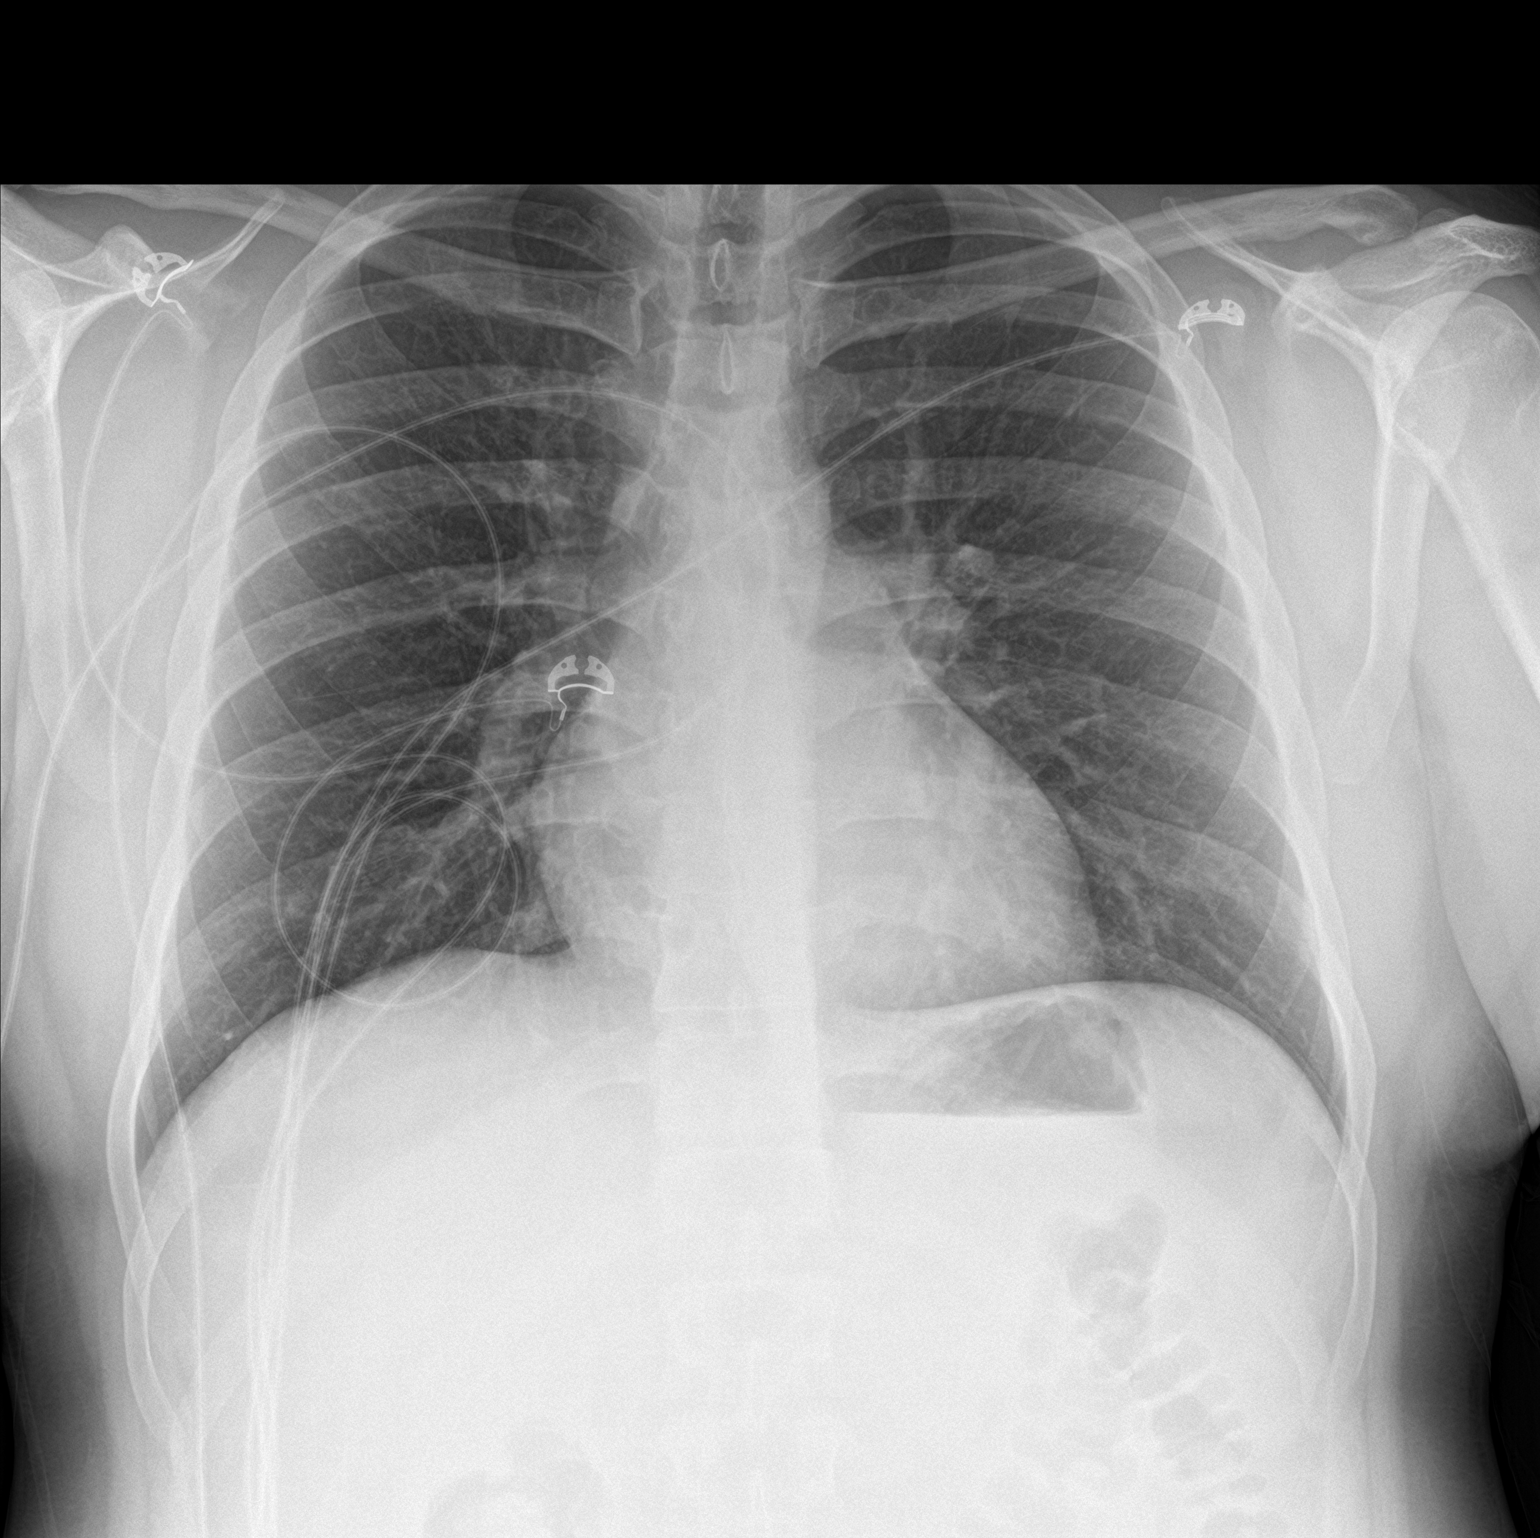

[chest lat]
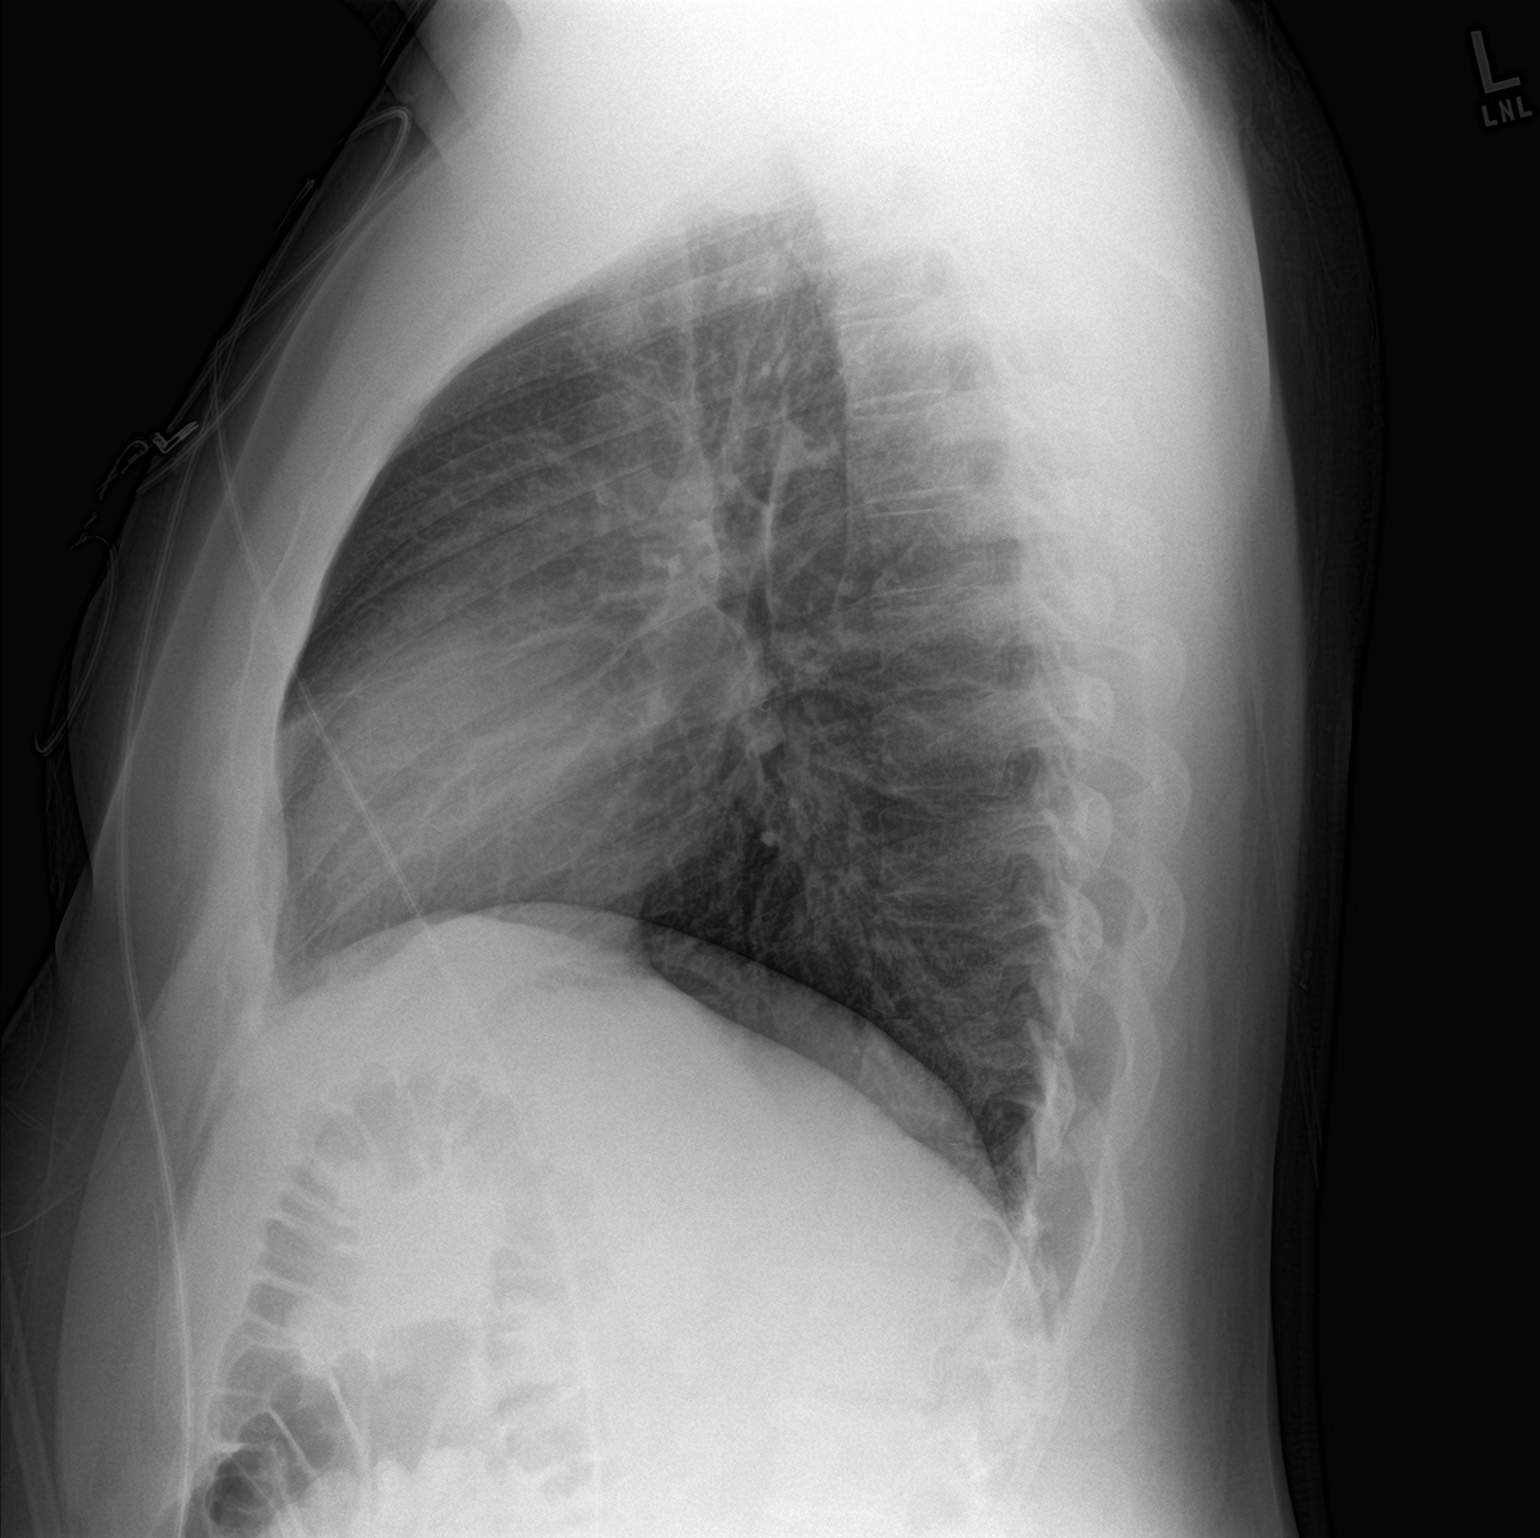

[2 of 2 positions shown; findings below may reference images not displayed]

FINDINGS: The lungs are well-aerated and clear. There is no evidence of focal
opacification, pleural effusion or pneumothorax.

The heart is normal in size; the mediastinal contour is within
normal limits. No acute osseous abnormalities are seen.
IMPRESSION: No acute cardiopulmonary process seen.

## 2019-10-23 DIAGNOSIS — J343 Hypertrophy of nasal turbinates: Secondary | ICD-10-CM | POA: Insufficient documentation

## 2019-10-23 DIAGNOSIS — J342 Deviated nasal septum: Secondary | ICD-10-CM | POA: Insufficient documentation

## 2020-03-12 DIAGNOSIS — K760 Fatty (change of) liver, not elsewhere classified: Secondary | ICD-10-CM | POA: Insufficient documentation

## 2020-04-02 ENCOUNTER — Ambulatory Visit (INDEPENDENT_AMBULATORY_CARE_PROVIDER_SITE_OTHER): Payer: 59

## 2020-04-02 ENCOUNTER — Encounter: Payer: Self-pay | Admitting: Sports Medicine

## 2020-04-02 ENCOUNTER — Other Ambulatory Visit: Payer: Self-pay | Admitting: Sports Medicine

## 2020-04-02 ENCOUNTER — Ambulatory Visit (INDEPENDENT_AMBULATORY_CARE_PROVIDER_SITE_OTHER): Payer: 59 | Admitting: Sports Medicine

## 2020-04-02 ENCOUNTER — Other Ambulatory Visit: Payer: Self-pay

## 2020-04-02 ENCOUNTER — Ambulatory Visit: Payer: 59 | Admitting: Sports Medicine

## 2020-04-02 DIAGNOSIS — M79671 Pain in right foot: Secondary | ICD-10-CM

## 2020-04-02 DIAGNOSIS — M722 Plantar fascial fibromatosis: Secondary | ICD-10-CM

## 2020-04-02 DIAGNOSIS — M79672 Pain in left foot: Secondary | ICD-10-CM

## 2020-04-02 DIAGNOSIS — M7662 Achilles tendinitis, left leg: Secondary | ICD-10-CM

## 2020-04-02 DIAGNOSIS — M216X1 Other acquired deformities of right foot: Secondary | ICD-10-CM

## 2020-04-02 DIAGNOSIS — M216X2 Other acquired deformities of left foot: Secondary | ICD-10-CM

## 2020-04-02 DIAGNOSIS — M7661 Achilles tendinitis, right leg: Secondary | ICD-10-CM

## 2020-04-02 NOTE — Progress Notes (Signed)
Subjective: Hayden Willis is a 24 y.o. male patient presents to office with complaint of moderate to severe heel pain on the left>right. Patient admits to post static dyskinesia for a couple of years in duration, 10/10. Patient has treated this problem with stretching and massage with no complete relief. Denies any other pedal complaints.   Review of Systems  All other systems reviewed and are negative.   Patient Active Problem List   Diagnosis Date Noted  . Fatty liver 03/12/2020  . Deviated septum 10/23/2019  . Hypertrophy, nasal, turbinate 10/23/2019  . Severe obesity (BMI 35.0-39.9) with comorbidity ([place]) 08/16/2018  . Exercise-induced bronchospasm 12/04/2016  . Gastroesophageal reflux disease without esophagitis 12/04/2016  . Moderate persistent asthma with acute exacerbation 12/04/2016  . NONALLOPATHIC LESION OF LOWER EXTREMITIES NEC 06/21/2010  . FOOT PAIN, LEFT 06/08/2010  . COCCYGEAL PAIN 02/14/2010  . ABRASION/FRICION BURN OTH MX&UNS SITE W/O INF 02/14/2010  . MUSCLE STRAIN, HAMSTRING MUSCLE 12/30/2009  . TMJ PAIN 09/25/2009  . KNEE PAIN, RIGHT 09/25/2009  . ATTENTION DEFICIT HYPERACTIVITY DISORDER 08/16/2009  . Contact dermatitis and other eczema due to plants (except food) 02/20/2008    Current Outpatient Medications on File Prior to Visit  Medication Sig Dispense Refill  . albuterol (PROVENTIL HFA;VENTOLIN HFA) 108 (90 Base) MCG/ACT inhaler Inhale 1-2 puffs into the lungs every 6 (six) hours as needed for wheezing.     Marland Kitchen albuterol (PROVENTIL HFA;VENTOLIN HFA) 108 (90 Base) MCG/ACT inhaler Inhale 1-2 puffs into the lungs every 6 (six) hours as needed for wheezing or shortness of breath. 1 Inhaler 0  . loratadine (CLARITIN) 10 MG tablet Take 1 tablet by mouth daily.    Marland Kitchen omeprazole (PRILOSEC) 20 MG capsule Take 1 capsule (20 mg total) by mouth daily. (Patient not taking: Reported on 08/31/2018) 30 capsule 3  . ondansetron (ZOFRAN) 4 MG tablet tablet    . predniSONE  (DELTASONE) 50 MG tablet One tablet PO daily for 4 days 4 tablet 0   No current facility-administered medications on file prior to visit.    No Known Allergies  Objective: Physical Exam General: The patient is alert and oriented x3 in no acute distress.  Dermatology: Skin is warm, dry and supple bilateral lower extremities. Nails 1-10 are normal. There is no erythema, edema, no eccymosis, no open lesions present. Integument is otherwise unremarkable.  Vascular: Dorsalis Pedis pulse and Posterior Tibial pulse are 2/4 bilateral. Capillary fill time is immediate to all digits.  Neurological: Grossly intact to light touch bilateral.  Musculoskeletal: Tenderness to palpation at the medial calcaneal tubercale and through the insertion of the plantar fascia and achilles on the left and right foot. No pain with compression of calcaneus bilateral. No pain with tuning fork to calcaneus bilateral. No pain with calf compression bilateral. There is decreased Ankle joint range of motion bilateral. All other joints range of motion within normal limits bilateral. Strength 5/5 in all groups bilateral.   Gait: Unassisted, Antalgic   Xray, Right/Left foot:  Normal osseous mineralization. Joint spaces preserved. No fracture/dislocation/boney destruction. Minimal Calcaneal spur present with mild thickening of plantar fascia. No other soft tissue abnormalities or radiopaque foreign bodies.   Assessment and Plan: Problem List Items Addressed This Visit    None    Visit Diagnoses    Plantar fasciitis, bilateral    -  Primary   Achilles tendonitis, bilateral       Heel pain, bilateral       Acquired equinus deformity of both feet          -  Complete examination performed.  -Xrays reviewed -Discussed with patient in detail the condition of plantar fasciitis and achilles, how this occurs and general treatment options. Explained both conservative and surgical treatments.  -Patient declined medications and  injection at this time  -Recommended good supportive shoes and advised use of heel lifts as dispensed this visit -Explained in detail the use of the Night splint which was dispensed at today's visit. -Explained and dispensed to patient daily stretching exercises. -Recommend patient to ice affected area 1-2x daily. -Patient to return to office when ready for EPAT or sooner if problems or questions arise.  Asencion Islam, DPM

## 2020-04-14 ENCOUNTER — Other Ambulatory Visit: Payer: Self-pay | Admitting: Sports Medicine

## 2020-04-14 DIAGNOSIS — M722 Plantar fascial fibromatosis: Secondary | ICD-10-CM

## 2020-10-27 ENCOUNTER — Encounter: Payer: Self-pay | Admitting: Nurse Practitioner

## 2020-11-05 ENCOUNTER — Other Ambulatory Visit: Payer: Self-pay

## 2020-11-07 NOTE — Progress Notes (Signed)
11/07/2020 Hayden Willis [id number] November 06, 1995   CHIEF COMPLAINT: Constipation, rectal bleeding  HISTORY OF PRESENT ILLNESS:  Hayden Willis is a 25 year old male with a past medical history of anxiety, asthma, fatty liver and GERD.  He presents to our office today for further evaluation regarding constipation and generalized abdominal pain.  He previously reported passing normal formed brown bowel movement every day then developed constipation 5 days ago.  He reported passing pellet-like stools and he feels like he has a blockage.  He describes feeling as if gas is trapped in his abdomen which he is having difficulty passing.  He reports seeing bright red blood on the toilet tissue which typically occurs after he wipes the anal area a lot after defecation which has occurred 10 to 15 times over the past few months. His water intake is inadequate.  He took MiraLAX x1 dose which was ineffective.  He took 2 stool softener/laxative tablets without passing a bowel movement.  Friday, 11/05/2020 he took a Dulcolax tablet which resulted in a large amount of diarrhea the next day.  He continues to have generalized abdominal bloat pressure pain and also has brief sharp pain that comes and goes which he attributes to gas pain.  He has anal itchiness which comes and goes for the past 3 to 4 months which occurs randomly throughout the day, not noticeable at nighttime.  He has mild nausea.  No vomiting.  He rinks 2 caffeinated sodas daily.  He skips breakfast.  He eats a low fiber diet.  He has a history of acid reflux.  He takes omeprazole once or twice weekly typically after he eats a spicy or acidic foods.  He sometimes feels as if food briefly get stuck in his upper throat area which typically occurs if he eats too quickly.  Drinks water and the food passes quickly.  He does not feel that food gets stuck in his esophagus.  He denies ever having an EGD.  No NSAID use.  He was recently prescribed escitalopram  10 mg daily for anxiety which she has not yet started.  Family history of IBD or colorectal cancer.  He reports having excessive coughing with nausea and vomiting following anesthesia from his nasal septal surgery 11/2018.  He stated his eyes were bloodshot and he had petechiae on his face.  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  No alcohol use. Nonsmoker. No drug use.   Family History: Mother age 7 HTN and diabetes.. Father age 10 with CVA, DM. Two brothers are healthy.   No Known Allergies    Outpatient Encounter Medications as of 11/08/2020  Medication Sig  . albuterol (PROVENTIL HFA;VENTOLIN HFA) 108 (90 Base) MCG/ACT inhaler Inhale 1-2 puffs into the lungs every 6 (six) hours as needed for wheezing or shortness of breath.  . escitalopram (LEXAPRO) 10 MG tablet Take 10 mg by mouth.  . loratadine (CLARITIN) 10 MG tablet Take 1 tablet by mouth daily.  Marland Kitchen omeprazole (PRILOSEC) 20 MG capsule Take 1 capsule (20 mg total) by mouth daily. (Patient not taking: Reported on 08/31/2018)   No facility-administered encounter medications on file as of 11/08/2020.   REVIEW OF SYSTEMS:  Gen: Denies fever, sweats or chills. No weight loss.  CV: Denies chest pain, palpitations or edema. Resp: Asthma is well controlled. Denies cough, shortness of breath of hemoptysis.  GI: See HPI. GU : Denies urinary burning, blood in urine, increased urinary frequency or incontinence. MS:  Denies joint pain, muscles aches or weakness. Derm: Denies rash, itchiness, skin lesions or unhealing ulcers. Psych: + anxiety. Heme: Denies bruising, bleeding. Neuro:  Denies headaches, dizziness or paresthesias. Endo:  Denies any problems with DM, thyroid or adrenal function.  PHYSICAL EXAM: BP 110/60   Pulse 92   Ht 5\' 7"  (1.702 m)   Wt 245 lb (111.1 kg)   BMI 38.37 kg/m   General: Well developed 25 year old male in no acute distress. Head: Normocephalic and atraumatic. Eyes:  Sclerae non-icteric, conjunctive  pink. Ears: Normal auditory acuity. Mouth: Dentition intact. No ulcers or lesions.  Neck: Supple, no lymphadenopathy or thyromegaly.  Lungs: Clear bilaterally to auscultation without wheezes, crackles or rhonchi. Heart: Regular rate and rhythm. No murmur, rub or gallop appreciated.  Abdomen: Soft, nontender, non distended. No masses. No hepatosplenomegaly. Normoactive bowel sounds x 4 quadrants.  Rectal: No external hemorrhoids. Small left posterior fissure oozed a scant amount of red blood. Posterior external anal area with a small venous appearing cluster with a minute fissure in the center with a scant amount of fresh red blood oozing on exam  CMA Melissa CMA present.  Melissa CMA present during exam. Musculoskeletal: Symmetrical with no gross deformities. Skin: Warm and dry. No rash or lesions on visible extremities. Extremities: No edema. Neurological: Alert oriented x 4, no focal deficits.  Psychological:  Alert and cooperative. Normal mood and affect.  ASSESSMENT AND PLAN:  35. 25 year old male the patient with associated generalized abdominal pain.  Negative abdominal exam today. -MiraLAX nightly, may take twice daily if needed -May take Senokot 1 tab every third night as needed -Increase water and fiber intake -CBC, CMP, TTG and IgA -Discussed scheduling abdominal/pelvic CT scan if his symptoms persist/worsen or if  his laboratory studies warrant further evaluation  2.  GERD with oral pharyngeal dysphagia -Patient wishes to defer EGD at this time, he wishes to avoid procedures which require sedation/anesthesia.  See HPI. -I discussed scheduling a barium swallow with tablet and he will consider this if his symptoms persist.  To discuss further at the time of his follow-up appointment with Dr. June in 4 to 6 weeks. -Omeprazole 40 mg p.o. daily -Patient to call our office if his symptoms worsen  3.  Anal fissure -Diltiazem 2%/lidocaine 2% ointment apply a small amount inside  the anal area into the external anal area 3 times daily for 6-week -Use unscented Dove soap when bathing -Avoid aggressive wiping after passing a bowel movement -To discuss scheduling a colonoscopy at the time of his follow-up appointment as well  4.  Anxiety -Continue follow-up with PCP       CC:  Lavon Paganini, MD

## 2020-11-08 ENCOUNTER — Encounter: Payer: Self-pay | Admitting: Nurse Practitioner

## 2020-11-08 ENCOUNTER — Ambulatory Visit: Payer: Managed Care, Other (non HMO) | Admitting: Nurse Practitioner

## 2020-11-08 ENCOUNTER — Other Ambulatory Visit (INDEPENDENT_AMBULATORY_CARE_PROVIDER_SITE_OTHER): Payer: Managed Care, Other (non HMO)

## 2020-11-08 VITALS — BP 110/60 | HR 92 | Ht 67.0 in | Wt 245.0 lb

## 2020-11-08 DIAGNOSIS — K219 Gastro-esophageal reflux disease without esophagitis: Secondary | ICD-10-CM

## 2020-11-08 DIAGNOSIS — K5900 Constipation, unspecified: Secondary | ICD-10-CM | POA: Insufficient documentation

## 2020-11-08 DIAGNOSIS — R1311 Dysphagia, oral phase: Secondary | ICD-10-CM | POA: Diagnosis not present

## 2020-11-08 DIAGNOSIS — K602 Anal fissure, unspecified: Secondary | ICD-10-CM

## 2020-11-08 DIAGNOSIS — K625 Hemorrhage of anus and rectum: Secondary | ICD-10-CM | POA: Diagnosis not present

## 2020-11-08 LAB — COMPREHENSIVE METABOLIC PANEL
ALT: 86 U/L — ABNORMAL HIGH (ref 0–53)
AST: 28 U/L (ref 0–37)
Albumin: 4.5 g/dL (ref 3.5–5.2)
Alkaline Phosphatase: 57 U/L (ref 39–117)
BUN: 16 mg/dL (ref 6–23)
CO2: 26 mEq/L (ref 19–32)
Calcium: 9.6 mg/dL (ref 8.4–10.5)
Chloride: 106 mEq/L (ref 96–112)
Creatinine, Ser: 1.15 mg/dL (ref 0.40–1.50)
GFR: 88.39 mL/min (ref >60.00)
Glucose, Bld: 97 mg/dL (ref 70–99)
Potassium: 4 mEq/L (ref 3.5–5.1)
Sodium: 139 mEq/L (ref 135–145)
Total Bilirubin: 0.6 mg/dL (ref 0.2–1.2)
Total Protein: 7.5 g/dL (ref 6.0–8.3)

## 2020-11-08 LAB — C-REACTIVE PROTEIN: CRP: <1 mg/dL (ref 0.5–20.0)

## 2020-11-08 LAB — CBC WITH DIFFERENTIAL/PLATELET
Basophils Absolute: 0.1 10*3/uL (ref 0.0–0.1)
Basophils Relative: 0.9 % (ref 0.0–3.0)
Eosinophils Absolute: 0.1 10*3/uL (ref 0.0–0.7)
Eosinophils Relative: 2.3 % (ref 0.0–5.0)
HCT: 45.7 % (ref 39.0–52.0)
Hemoglobin: 15.6 g/dL (ref 13.0–17.0)
Lymphocytes Relative: 33.5 % (ref 12.0–46.0)
Lymphs Abs: 2 10*3/uL (ref 0.7–4.0)
MCHC: 34.2 g/dL (ref 30.0–36.0)
MCV: 86.2 fl (ref 78.0–100.0)
Monocytes Absolute: 0.4 10*3/uL (ref 0.1–1.0)
Monocytes Relative: 7.5 % (ref 3.0–12.0)
Neutro Abs: 3.3 10*3/uL (ref 1.4–7.7)
Neutrophils Relative %: 55.8 % (ref 43.0–77.0)
Platelets: 206 10*3/uL (ref 150.0–400.0)
RBC: 5.3 Mil/uL (ref 4.22–5.81)
RDW: 13 % (ref 11.5–15.5)
WBC: 5.9 10*3/uL (ref 4.0–10.5)

## 2020-11-08 MED ORDER — OMEPRAZOLE 40 MG PO CPDR: 40.0000 mg | DELAYED_RELEASE_CAPSULE | Freq: Every day | ORAL | 0 refills | Status: AC

## 2020-11-08 MED ORDER — AMBULATORY NON FORMULARY MEDICATION: 1 refills | Status: AC

## 2020-11-08 NOTE — Patient Instructions (Addendum)
If you are age 26 or older, your body mass index should be between 23-30. Your Body mass index is 38.37 kg/m. If this is out of the aforementioned range listed, please consider follow up with your Primary Care Provider.  If you are age 26 or younger, your body mass index should be between 19-25. Your Body mass index is 38.37 kg/m. If this is out of the aformentioned range listed, please consider follow up with your Primary Care Provider.   LABS: Your provider has requested that you go to the basement level for lab work before leaving today. Press "B" on the elevator. The lab is located at the first door on the left as you exit the elevator.  HEALTHCARE LAWS AND MY CHART RESULTS: Due to recent changes in healthcare laws, you may see the results of your imaging and laboratory studies on MyChart before your provider has had a chance to review them.  We understand that in some cases there may be results that are confusing or concerning to you. Not all laboratory results come back in the same time frame and the provider may be waiting for multiple results in order to interpret others.  Please give [place] 48 hours in order for your provider to thoroughly review all the results before contacting the office for clarification of your results.     Please use: Diltiazem 2%/Lidocaine 2%  We have sent a prescription for Diltiazem 2%/Lidocaine 2% gel to [facility] for you. Using your index finger, you should apply a small amount of medication inside the anal opening and to the external anal area twice daily x 6 weeks.  [facility]'s information is below: Address: [address] [place], [place] [address]  [phone]  *Please DO NOT go directly from our office to pick up this medication! Give the pharmacy 1 day to process the prescription as this is compounded and takes time to make.  MEDICATION  We have sent the following medication to your pharmacy for you to pick up at your  convenience:  Omeprazole 40 MG once a day.  We have given you samples of the following medication to take: IBgard, one tablet daily.  OVER THE COUNTER MEDICATION  Please purchase the following medications over the counter and take as directed:   Miralax. Dissolve one capful in 8 ounces of water and drink before bed.  Please follow up with Dr. Lavon Paganini in 4-6 weeks  It was great seeing you today!  Thank you for entrusting me with your care and choosing [facility].  Arnaldo Natal, CRNP

## 2020-11-09 ENCOUNTER — Telehealth: Payer: Self-pay | Admitting: Nurse Practitioner

## 2020-11-09 LAB — TISSUE TRANSGLUTAMINASE ABS,IGG,IGA
(tTG) Ab, IgA: <1 U/mL
(tTG) Ab, IgG: <1 U/mL

## 2020-11-09 LAB — IGA: Immunoglobulin A: 205 mg/dL (ref 47–310)

## 2020-11-09 NOTE — Telephone Encounter (Signed)
Spoke to patient, he was calling regarding his [facility] message.

## 2020-11-09 NOTE — Telephone Encounter (Signed)
We are in clinic all day, I will have to wait until end of day to call him.

## 2020-11-09 NOTE — Telephone Encounter (Signed)
Patient calling regarding his visit yesterday would like to discuss something

## 2020-11-30 ENCOUNTER — Encounter: Payer: Self-pay | Admitting: Nurse Practitioner

## 2020-11-30 ENCOUNTER — Other Ambulatory Visit: Payer: Self-pay

## 2020-11-30 ENCOUNTER — Ambulatory Visit: Payer: Managed Care, Other (non HMO) | Admitting: Nurse Practitioner

## 2020-11-30 VITALS — BP 112/80 | HR 80 | Ht 64.0 in | Wt 243.4 lb

## 2020-11-30 DIAGNOSIS — K602 Anal fissure, unspecified: Secondary | ICD-10-CM | POA: Diagnosis not present

## 2020-11-30 DIAGNOSIS — K219 Gastro-esophageal reflux disease without esophagitis: Secondary | ICD-10-CM | POA: Diagnosis not present

## 2020-11-30 NOTE — Progress Notes (Signed)
11/30/2020 Hayden Willis [id number] 08/12/1996   Chief Complaint: Follow-up constipation, anal fissure  History of Present Illness: Hayden Willis is a 25 year old male with a past medical history of anxiety, asthma, fatty liver and GERD.    Initially saw the patient in the office on 11/08/2020 due to having constipation, generalized abdominal pain and rectal bleeding.  He was prescribed MiraLAX nightly which he took for a few nights then discontinued it as he felt his bowel movements were back to his normal pattern.  He was assessed to have a small anal fissure and he was prescribed diltiazem/lidocaine fissure ointment which he utilized for 3 days then discontinued.  He denies having any further rectal discomfort or rectal bleeding.  He was prescribed Omeprazole milligrams once daily which he took as needed.  He reported experiencing heartburn at night or after eating spicy or greasy foods on a few occasions since his last office appointment which was relieved by taking omeprazole or Tums.  He denies having any further difficulty swallowing.  He determined his difficulty swallowing occurred only when he ate too fast.  He is chewing his food thoroughly and taking more time to swallow without any difficulty swallowing.  He denies having any further gas buildup abdominal type pain.  He attributes his prior abdominal gas blockage due to eating 4 hotdogs in 1 day.  He does not wish to pursue any invasive endoscopic evaluation which would require sedation.  CBC Latest Ref Rng & Units 11/08/2020 08/31/2018 09/03/2015  WBC 4.0 - 10.5 K/uL 5.9 6.0 6.1  Hemoglobin 13.0 - 17.0 g/dL 62.1 30.8 65.7  Hematocrit 39.0 - 52.0 % 45.7 46.5 46.2  Platelets 150.0 - 400.0 K/uL 206.0 199 214   CMP Latest Ref Rng & Units 11/08/2020 08/31/2018 09/03/2015  Glucose 70 - 99 mg/dL 97 846(N) 79  BUN 6 - 23 mg/dL 16 15 13   Creatinine 0.40 - 1.50 mg/dL 6.29 5.28  Sodium 135 - 145 mEq/L 139 140 143  Potassium 3.5 - 5.1  mEq/L 4.0 4.0 4.9  Chloride 96 - 112 mEq/L 106 112(H) 104  CO2 19 - 32 mEq/L 26 23 26   Calcium 8.4 - 10.5 mg/dL 9.6 9.1 9.7  Total Protein 6.0 - 8.3 g/dL 7.5 - 6.7  Total Bilirubin 0.2 - 1.2 mg/dL 0.6 - 0.5  Alkaline Phos 39 - 117 U/L 57 - 61  AST 0 - 37 U/L 28 - 36  ALT 0 - 53 U/L 86(H) - 108(H)  TTG IgA and IgG less than 1.  IgA 205.  Current Outpatient Medications on File Prior to Visit  Medication Sig Dispense Refill  . albuterol (PROVENTIL HFA;VENTOLIN HFA) 108 (90 Base) MCG/ACT inhaler Inhale 1-2 puffs into the lungs every 6 (six) hours as needed for wheezing or shortness of breath. 1 Inhaler 0  . AMBULATORY NON FORMULARY MEDICATION Medication Name: Diltiazem 2%/Lidocaine 2%   Using your index finger apply a small amount of medication inside the anal opening and to the external anal area twice daily x 4 weeks. (Patient not taking: Reported on 11/30/2020) 30 g 1  . escitalopram (LEXAPRO) 10 MG tablet Take 10 mg by mouth. (Patient not taking: Reported on 11/30/2020)    . omeprazole (PRILOSEC) 40 MG capsule Take 1 capsule (40 mg total) by mouth daily. (Patient not taking: Reported on 11/30/2020) 90 capsule 0   No current facility-administered medications on file prior to visit.   No Known Allergies    Current Medications, Allergies,  Past Medical History, Past Surgical History, Family History and Social History were reviewed in [employer] record.   Review of Systems:   Constitutional: Negative for fever, sweats, chills or weight loss.  Respiratory: Negative for shortness of breath.   Cardiovascular: Negative for chest pain, palpitations and leg swelling.  Gastrointestinal: See HPI.  Musculoskeletal: Negative for back pain or muscle aches.  Neurological: Negative for dizziness, headaches or paresthesias.    Physical Exam: BP 112/80   Pulse 80   Ht 5\' 4"  (1.626 m)   Wt 243 lb 6.4 oz (110.4 kg)   BMI 41.78 kg/m  General: 25 year old male in no acute  distress. Head: Normocephalic and atraumatic. Eyes: No scleral icterus. Conjunctiva pink . Ears: Normal auditory acuity. Mouth: Dentition intact. No ulcers or lesions.  Lungs: Clear throughout to auscultation. Heart: Regular rate and rhythm, no murmur. Abdomen: Soft, nontender and nondistended. No masses or hepatomegaly. Normal bowel sounds x 4 quadrants.  Rectal: Deferred.  Musculoskeletal: Symmetrical with no gross deformities. Extremities: No edema. Neurological: Alert oriented x 4. No focal deficits.  Psychological: Alert and cooperative. Normal mood and affect  Assessment and Recommendations:  1.  GERD, stable.  No further dysphagia. -Patient declines EGD or barium swallow for further evaluation -GERD diet encouraged -Omeprazole 40 mg once daily as needed  2.  Constipation, resolved. -MiraLAX nightly as needed  3.  Anal fissure, improved.  No further rectal discomfort or rectal bleeding. -Patient advised to restart diltiazem/lidocaine fissure ointment with anal discomfort/bleeding recurs -Patient declines flexible sigmoidoscopy versus colonoscopy  4. History of fatty liver. ALT 86 ( ALT 108 on 09/03/2015). -Healthy diet encouraged. -Repeat Hepatic panel at time of next follow up appointment in 2 to 3 months  Hayden Willis will follow up in the office in 2 to 3 months with Dr. Davina Poke to further review his GI symptoms and if he continues to have rectal bleeding a sigmoidoscopy versus colonoscopy to be considered.  He will call our office if his symptoms worsen.

## 2020-11-30 NOTE — Patient Instructions (Signed)
If you are age 26 or older, your body mass index should be between 23-30. Your Body mass index is 41.78 kg/m. If this is out of the aforementioned range listed, please consider follow up with your Primary Care Provider.  If you are age 93 or younger, your body mass index should be between 19-25. Your Body mass index is 41.78 kg/m. If this is out of the aformentioned range listed, please consider follow up with your Primary Care Provider.    Follow up with Dr. Lavon Paganini is 2-3 months.  Take the Omeprazole as needed.  Please [place] the anal fissure cream three times a day as previously ordered.  It was great seeing you today!  Thank you for entrusting me with your care and choosing [facility].  Arnaldo Natal, CRNP

## 2020-12-13 NOTE — Progress Notes (Signed)
Reviewed and agree with documentation and assessment and plan. K. Veena Ceria Suminski , MD   

## 2020-12-13 NOTE — Progress Notes (Signed)
Reviewed and agree with documentation and assessment and plan. K. Veena Porsche Noguchi , MD
